# Patient Record
Sex: Female | Born: 2000 | Race: Asian | Hispanic: No | Marital: Single | Smoking: Never smoker
Health system: Southern US, Community
[De-identification: ages and names within clinical notes are randomized; demographics above are authoritative.]

## PROBLEM LIST (undated history)

## (undated) DIAGNOSIS — N390 Urinary tract infection, site not specified: Secondary | ICD-10-CM

## (undated) HISTORY — PX: APPENDECTOMY: SHX54

---
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
## 2021-01-01 LAB — MONONUCLEOSIS SCREEN: Mono Screen: NEGATIVE

## 2021-01-01 MED ORDER — DEXAMETHASONE SODIUM PHOSPHATE 10 MG/ML IJ SOLN
10.0000 mg | Freq: Once | INTRAMUSCULAR | Status: AC
  Administered 2021-01-01: 10 mg via INTRAMUSCULAR
  Filled 2021-01-01: qty 1

## 2021-01-01 MED ORDER — IBUPROFEN 400 MG PO TABS
600.0000 mg | ORAL_TABLET | Freq: Once | ORAL | Status: AC
  Administered 2021-01-01: 600 mg via ORAL
  Filled 2021-01-01: qty 1
